# Patient Record
Sex: Male | Born: 2000 | Race: White | Hispanic: No | Marital: Single | Smoking: Current every day smoker
Health system: Southern US, Community
[De-identification: ages and names within clinical notes are randomized; demographics above are authoritative.]

## PROBLEM LIST (undated history)

## (undated) DIAGNOSIS — J45909 Unspecified asthma, uncomplicated: Secondary | ICD-10-CM

## (undated) DIAGNOSIS — R569 Unspecified convulsions: Secondary | ICD-10-CM

## (undated) DIAGNOSIS — F909 Attention-deficit hyperactivity disorder, unspecified type: Secondary | ICD-10-CM

## (undated) HISTORY — PX: OTHER SURGICAL HISTORY: SHX169

## (undated) HISTORY — PX: TONSILLECTOMY: SUR1361

## (undated) HISTORY — PX: ADENOIDECTOMY: SUR15

---
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
## 2022-09-24 ENCOUNTER — Encounter (HOSPITAL_COMMUNITY): Payer: Self-pay

## 2022-09-24 ENCOUNTER — Ambulatory Visit (HOSPITAL_COMMUNITY)
Admission: EM | Admit: 2022-09-24 | Discharge: 2022-09-24 | Disposition: A | Discharge status: Home / Self Care | Payer: Medicaid Other | Attending: Internal Medicine | Admitting: Internal Medicine

## 2022-09-24 DIAGNOSIS — M546 Pain in thoracic spine: Secondary | ICD-10-CM | POA: Diagnosis not present

## 2022-09-24 MED ORDER — METHOCARBAMOL 500 MG PO TABS: 500.0000 mg | ORAL_TABLET | Freq: Three times a day (TID) | ORAL | 0 refills | Status: AC | PRN

## 2022-09-24 MED ORDER — IBUPROFEN 600 MG PO TABS: 600.0000 mg | ORAL_TABLET | Freq: Two times a day (BID) | ORAL | 0 refills | Status: AC | PRN

## 2022-09-24 NOTE — Discharge Instructions (Signed)
You were seen today for thoracic back pain.  We are treating you with anti-inflammatories and muscle relaxers.  Please be aware that the muscle relaxers may cause sedation.  I encouraged heat, stretching and massage.  Please follow-up with your PCP if symptoms persist or worsen.

## 2022-09-24 NOTE — ED Triage Notes (Signed)
Pt presents to the office for back pain that started yesterday. Pt reports his back hurts so bad.

## 2022-09-24 NOTE — ED Provider Notes (Signed)
Keith Powers    CSN: [id number] Arrival date & time: 09/24/22  1259      History   Chief Complaint Chief Complaint  Patient presents with   Back Pain    HPI Keith Powers is a 21 y.o. male with a history of ADHD, asthma, seizures and depression presents to UC today with complaint of thoracic back pain.  He reports this started this morning.  He describes the pain as sharp and stabbing.  The pain does not radiate.  The pain is worse with movement.  He denies any decrease in range of motion.  He denies numbness, tingling or weakness of his upper or lower extremities.  He has not noticed any rash in the area.  He denies any injury to the area.  He does do heavy lifting at work but reports he did not work today.  He has tried Tylenol OTC with minimal relief of symptoms.  HPI  Past Medical History:  Diagnosis Date   ADHD    Asthma    Seizures [facility])     Patient Active Problem List   Diagnosis Date Noted   DMDD (disruptive mood dysregulation disorder) ([place]) 07/24/2017   MDD (major depressive disorder), recurrent severe, without psychosis ([place]) 07/24/2017   Suicidal ideation 07/24/2017   MDD (major depressive disorder) 07/23/2017    Past Surgical History:  Procedure Laterality Date   ADENOIDECTOMY     Cyst removal left wrist     TONSILLECTOMY         Home Medications    Prior to Admission medications   Medication Sig Start Date End Date Taking? Authorizing Provider  ibuprofen (ADVIL) 600 MG tablet Take 1 tablet (600 mg total) by mouth 2 (two) times daily as needed. 09/24/22  Yes Declyn Delsol, Coralie Keens, NP  methocarbamol (ROBAXIN) 500 MG tablet Take 1 tablet (500 mg total) by mouth every 8 (eight) hours as needed for muscle spasms. 09/24/22  Yes Fabricio Endsley, Coralie Keens, NP  ARIPiprazole (ABILIFY) 5 MG tablet Take 1 tablet (5 mg total) by mouth 2 (two) times daily. 01/17/18   Mordecai Maes, NP  divalproex (DEPAKOTE ER) 250 MG 24 hr tablet Take 3 tablets (750 mg total) by  mouth 2 (two) times daily. 01/17/18   Mordecai Maes, NP  ondansetron (ZOFRAN) 4 MG tablet Take 1 tablet (4 mg total) by mouth daily as needed for nausea or vomiting. 12/31/21   Willaim Rayas, NP  traZODone (DESYREL) 50 MG tablet Take 1 tablet (50 mg total) by mouth at bedtime. 01/17/18   Mordecai Maes, NP    Family History Family History  Problem Relation Age of Onset   Bipolar disorder Mother     Social History Social History   Tobacco Use   Smoking status: Never   Smokeless tobacco: Never  Vaping Use   Vaping Use: Never used  Substance Use Topics   Alcohol use: No   Drug use: No     Allergies   Shrimp [shellfish allergy]   Review of Systems Review of Systems  Respiratory:  Negative for cough, chest tightness and shortness of breath.   Cardiovascular:  Negative for chest pain.  Musculoskeletal:  Positive for back pain.       Thoracic  Skin:  Negative for rash.  Neurological:  Negative for weakness.     Physical Exam Triage Vital Signs ED Triage Vitals  Enc Vitals Group     BP 09/24/22 1426 (!) 157/96     Pulse Rate 09/24/22  1425 91     Resp 09/24/22 1425 20     Temp 09/24/22 1426 98 F (36.7 C)     Temp Source 09/24/22 1425 Oral     SpO2 09/24/22 1425 100 %     Weight --      Height --      Head Circumference --      Peak Flow --      Pain Score --      Pain Loc --      Pain Edu? --      Excl. in GC? --    No data found.  Updated Vital Signs BP (!) 157/96 (BP Location: Left Arm)   Pulse 87   Temp 98 F (36.7 C) (Oral)   Resp 20   SpO2 99%    Physical Exam Constitutional:      Appearance: He is normal weight.  Cardiovascular:     Rate and Rhythm: Normal rate and regular rhythm.  Pulmonary:     Effort: Pulmonary effort is normal.     Breath sounds: Normal breath sounds.  Musculoskeletal:        General: Tenderness present. Normal range of motion.     Comments: Pain with palpation over the thoracic spine and bilateral parathoracic  muscles.  Strength 5/5 BUE/BLE.  Skin:    Findings: No rash.  Neurological:     Mental Status: He is alert and oriented to person, place, and time.      UC Treatments / Results  Medications Ordered in UC Medications - No data to display  Initial Impression / Assessment and Plan / UC Course  I have reviewed the triage vital signs and the nursing notes.  Pertinent labs & imaging results that were available during my care of the patient were reviewed by me and considered in my medical decision making (see chart for details).    21 year old male with complaint of thoracic back pain x1 day.  DDx include osteoarthritis, scoliosis, thoracic strain, muscle strain of the thoracic region.  No indication for imaging at this time.  Exam indicates likely muscular strain of the thoracic region.  Will treat with Ibuprofen 600 mg twice daily x5 days.  We will also provide Rx for methocarbamol 500 mg every 8 hours as needed.  Encouraged heat, stretching and massage.  He will follow-up with his PCP if symptoms persist or worsen.  Final Clinical Impressions(s) / UC Diagnoses   Final diagnoses:  Acute bilateral thoracic back pain     Discharge Instructions      You were seen today for thoracic back pain.  We are treating you with anti-inflammatories and muscle relaxers.  Please be aware that the muscle relaxers may cause sedation.  I encouraged heat, stretching and massage.  Please follow-up with your PCP if symptoms persist or worsen.     ED Prescriptions     Medication Sig Dispense Auth. Provider   ibuprofen (ADVIL) 600 MG tablet Take 1 tablet (600 mg total) by mouth 2 (two) times daily as needed. 10 tablet Lorre Munroe, NP   methocarbamol (ROBAXIN) 500 MG tablet Take 1 tablet (500 mg total) by mouth every 8 (eight) hours as needed for muscle spasms. 15 tablet Lorre Munroe, NP      PDMP not reviewed this encounter.   Lorre Munroe, NP 09/24/22 1506

## 2023-01-26 IMAGING — CR DG SHOULDER 2+V*R*
2 series · 2 of 2 positions shown · non-contrast
Comparison: None.

CLINICAL DATA: Shoulder injury

EXAM:
RIGHT SHOULDER - 2+ VIEW

[w shoulder ap internal righ]
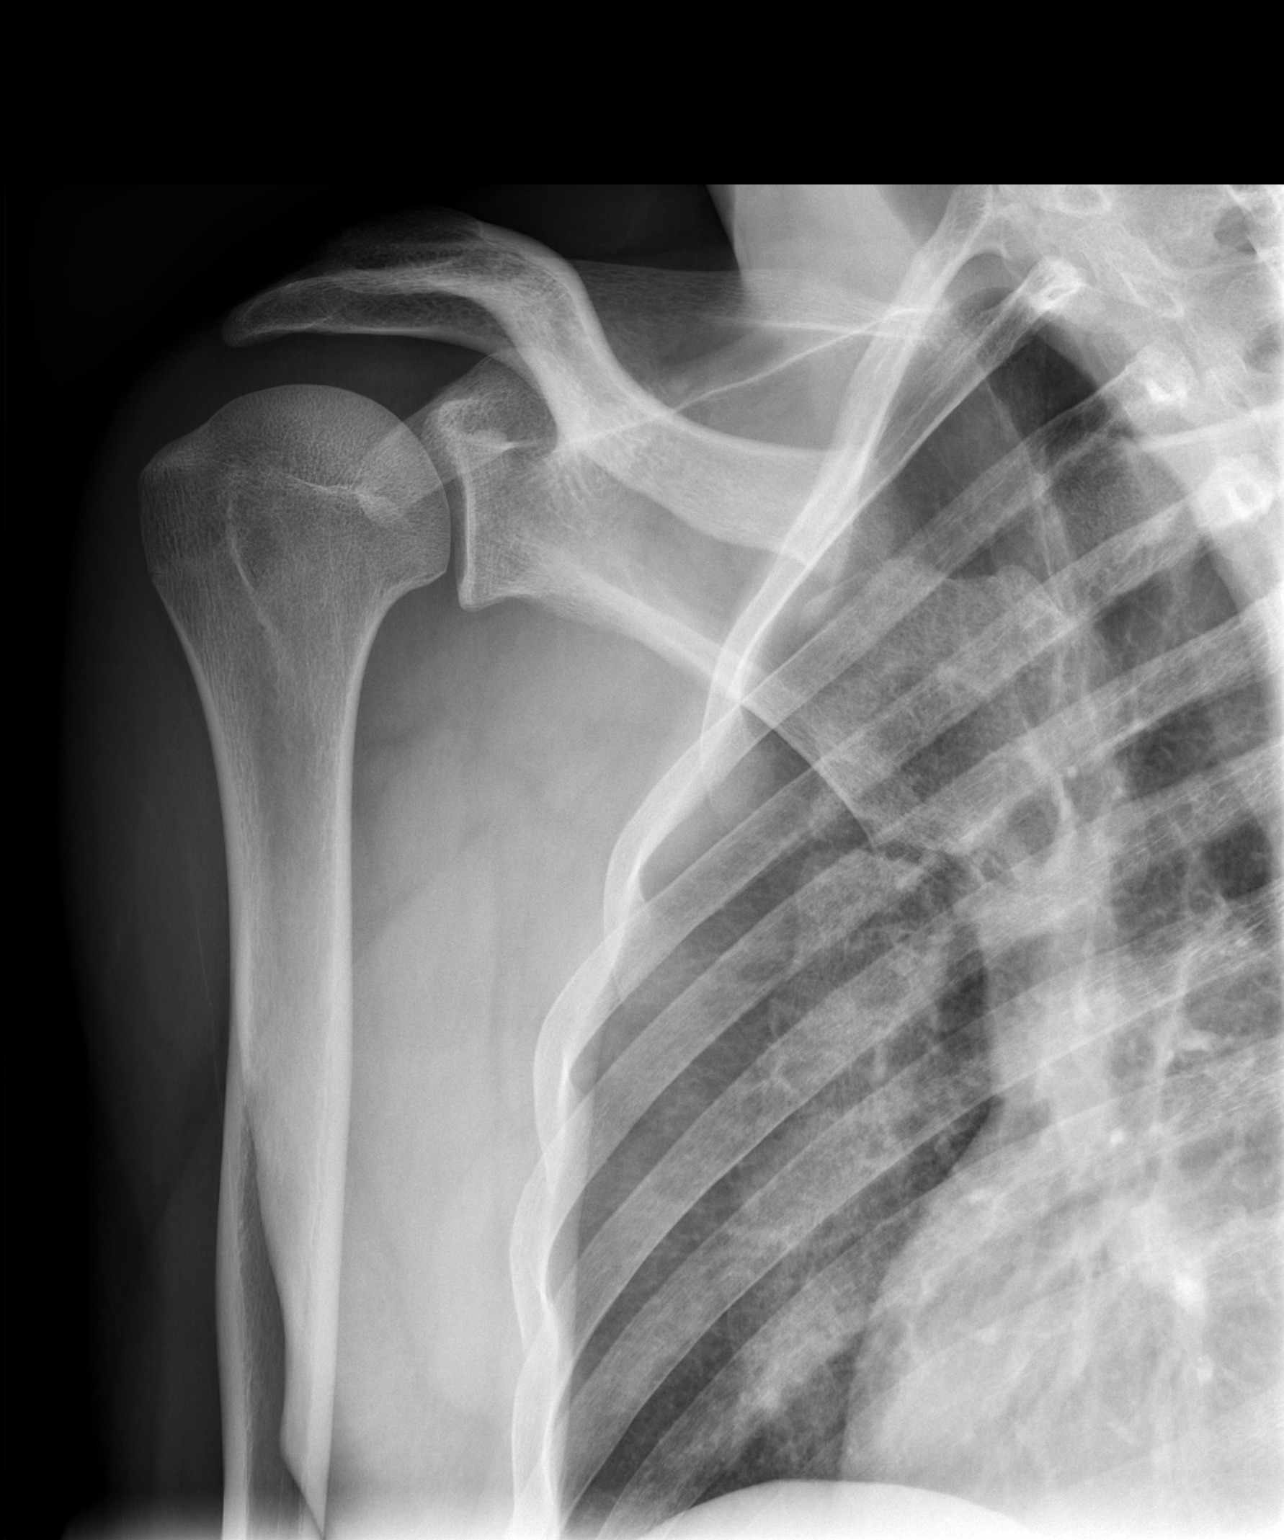

[w shoulder y view right]
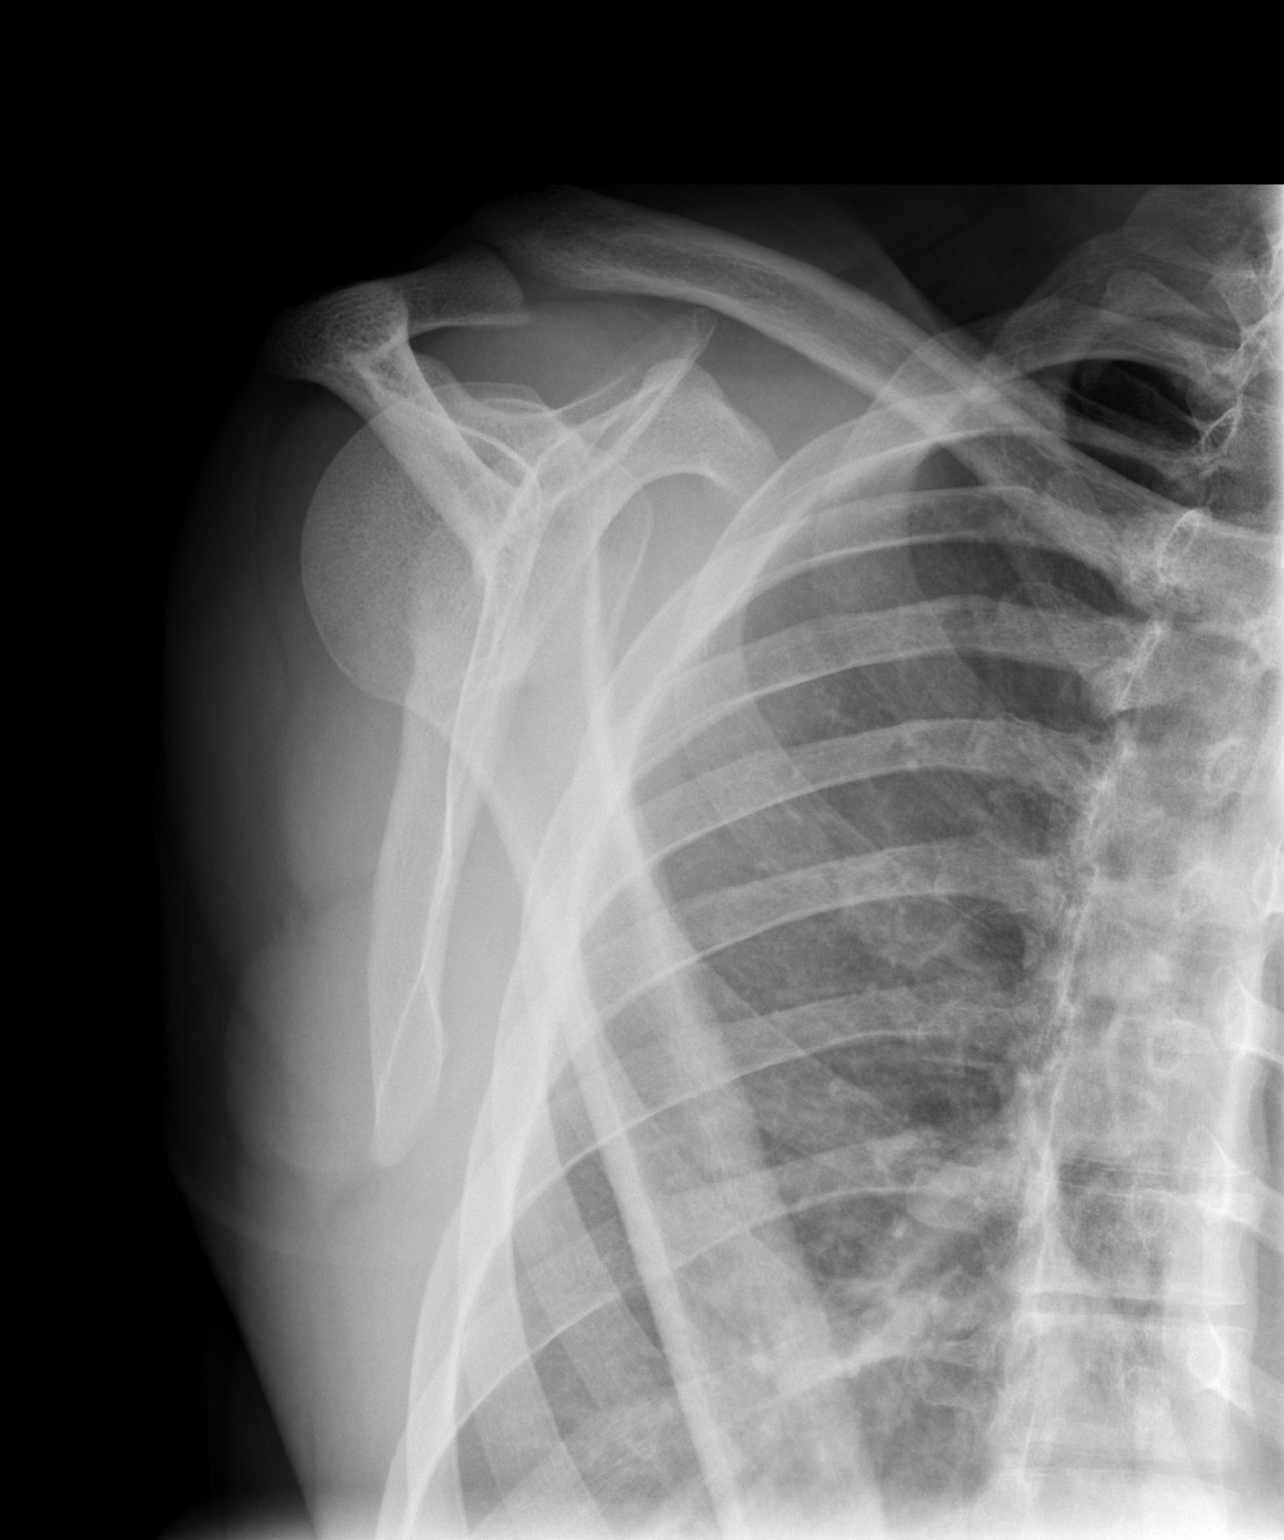

[2 of 2 positions shown; findings below may reference images not displayed]

FINDINGS: There is no evidence of fracture or dislocation. There is no
evidence of arthropathy or other focal bone abnormality. Soft
tissues are unremarkable.
IMPRESSION: Negative.

## 2023-07-16 ENCOUNTER — Other Ambulatory Visit: Payer: Self-pay

## 2023-07-16 ENCOUNTER — Telehealth: Payer: Self-pay | Admitting: Internal Medicine

## 2023-07-16 ENCOUNTER — Encounter: Payer: Self-pay | Admitting: *Deleted

## 2023-07-16 ENCOUNTER — Ambulatory Visit: Payer: BLUE CROSS/BLUE SHIELD

## 2023-07-16 ENCOUNTER — Ambulatory Visit
Admission: EM | Admit: 2023-07-16 | Discharge: 2023-07-16 | Disposition: A | Discharge status: Home / Self Care | Payer: BLUE CROSS/BLUE SHIELD | Attending: Internal Medicine | Admitting: Internal Medicine

## 2023-07-16 DIAGNOSIS — L03012 Cellulitis of left finger: Secondary | ICD-10-CM | POA: Diagnosis not present

## 2023-07-16 DIAGNOSIS — M79645 Pain in left finger(s): Secondary | ICD-10-CM

## 2023-07-16 MED ORDER — CEPHALEXIN 500 MG PO CAPS: 500.0000 mg | ORAL_CAPSULE | Freq: Four times a day (QID) | ORAL | 0 refills | Status: DC

## 2023-07-16 NOTE — Telephone Encounter (Signed)
Called patient to discuss x-ray results with no acute abnormality.  No change in therapy at this time.  All questions answered.

## 2023-07-16 NOTE — Discharge Instructions (Addendum)
I will call with x-ray results.  Antibiotic has been prescribed.  Follow-up if any symptoms persist or worsen.

## 2023-07-16 NOTE — ED Triage Notes (Signed)
Had small blister tip of left pinky finger- tried to pop it and now it is swollen and painful

## 2023-07-16 NOTE — ED Provider Notes (Signed)
[name]    CSN: [id number] Arrival date & time: 07/16/23  1407      History   Chief Complaint Chief Complaint  Patient presents with   Wound Check    HPI Keith Powers is a 22 y.o. male.   Patient presents with swelling, pain, erythema that started about 2 days ago to the tip of the left fifth digit.  Denies any injury to the area.  States that he popped it this morning and had some purulent drainage.  Denies any fever.   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 Authorizing Provider  cephALEXin (KEFLEX) 500 MG capsule Take 1 capsule (500 mg total) by mouth 4 (four) times daily. 07/16/23  Yes , Rolly Salter E, FNP  ARIPiprazole (ABILIFY) 5 MG tablet Take 1 tablet (5 mg total) by mouth 2 (two) times daily. 01/17/18   Denzil Magnuson, NP  divalproex (DEPAKOTE ER) 250 MG 24 hr tablet Take 3 tablets (750 mg total) by mouth 2 (two) times daily. 01/17/18   Denzil Magnuson, NP  ibuprofen (ADVIL) 600 MG tablet Take 1 tablet (600 mg total) by mouth 2 (two) times daily as needed. 09/24/22   Lorre Munroe, NP  methocarbamol (ROBAXIN) 500 MG tablet Take 1 tablet (500 mg total) by mouth every 8 (eight) hours as needed for muscle spasms. 09/24/22   Lorre Munroe, NP  ondansetron (ZOFRAN) 4 MG tablet Take 1 tablet (4 mg total) by mouth daily as needed for nausea or vomiting. 12/31/21   Herschell Dimes, NP   traZODone (DESYREL) 50 MG tablet Take 1 tablet (50 mg total) by mouth at bedtime. 01/17/18   Denzil Magnuson, NP    Family History Family History  Problem Relation Age of Onset   Bipolar disorder Mother     Social History Social History   Tobacco Use   Smoking status: Every Day    Types: Cigarettes, Cigars   Smokeless tobacco: Never  Vaping Use   Vaping status: Every Day  Substance Use Topics   Alcohol use: No   Drug use: No     Allergies   Shrimp [shellfish allergy]   Review of Systems Review of Systems Per HPI  Physical Exam Triage Vital Signs ED Triage Vitals  Encounter Vitals Group     BP 07/16/23 1513 (!) 140/84     Systolic BP Percentile --      Diastolic BP Percentile --      Pulse Rate 07/16/23 1513 90     Resp 07/16/23 1513 18     Temp 07/16/23 1513 97.9 F (36.6 C)     Temp Source 07/16/23 1513 Oral     SpO2 07/16/23 1513 97 %     Weight --      Height --      Head Circumference --  Peak Flow --      Pain Score 07/16/23 1511 10     Pain Loc --      Pain Education --      Exclude from Growth Chart --    No data found.  Updated Vital Signs BP (!) 140/84 (BP Location: Right Arm)   Pulse 90   Temp 97.9 F (36.6 C) (Oral)   Resp 18   SpO2 97%   Visual Acuity Right Eye Distance:   Left Eye Distance:   Bilateral Distance:    Right Eye Near:   Left Eye Near:    Bilateral Near:     Physical Exam Constitutional:      General: He is not in acute distress.    Appearance: Normal appearance. He is not toxic-appearing or diaphoretic.  HENT:     Head: Normocephalic and atraumatic.  Eyes:     Extraocular Movements: Extraocular movements intact.     Conjunctiva/sclera: Conjunctivae normal.  Pulmonary:     Effort: Pulmonary effort is normal.  Skin:    Comments: Patient has erythema and mild to moderate swelling present to the distal end of the left fifth digit.  Blisterlike lesion present to the end of the finger as well with no  purulent drainage.  Patient has full range of motion of finger and capillary refill and pulses seem to be intact. Nail is intact.   Neurological:     General: No focal deficit present.     Mental Status: He is alert and oriented to person, place, and time. Mental status is at baseline.  Psychiatric:        Mood and Affect: Mood normal.        Behavior: Behavior normal.        Thought Content: Thought content normal.        Judgment: Judgment normal.      UC Treatments / Results  Labs (all labs ordered are listed, but only abnormal results are displayed) Labs Reviewed - No data to display  EKG   Radiology DG Finger Little Left  Result Date: 07/16/2023 CLINICAL DATA:  Finger infection. Small blister at tip of left fifth finger. Tried to pop it now swollen and painful. EXAM: LEFT FINGER(S) - 2+ VIEW COMPARISON:  Left hand radiographs 01/20/2021 FINDINGS: Interval screw fixation of the prior distal fifth metacarpal fracture seen on remote 01/20/2021 radiographs with improved, now normal alignment. No evidence of hardware failure. Normal bone mineralization. The cortices are intact. Joint spaces are preserved. No acute fracture or dislocation. No subcutaneous air. IMPRESSION: 1. No radiographic evidence of acute osteomyelitis of the fifth finger. 2. Interval ORIF of the prior fifth metacarpal fracture. Electronically Signed   By: Neita Garnet M.D.   On: 07/16/2023 15:53    Procedures Procedures (including critical care time)  Medications Ordered in UC Medications - No data to display  Initial Impression / Assessment and Plan / UC Course  I have reviewed the triage vital signs and the nursing notes.  Pertinent labs & imaging results that were available during my care of the patient were reviewed by me and considered in my medical decision making (see chart for details).     X-ray completed to ensure there are no signs of osteomyelitis.  It was negative for any acute bony  abnormality.  Suspect paronychia versus cellulitis of finger so will treat with cephalexin.  Patient advised to monitor closely and follow-up if any symptoms persist or worsen.  Patient verbalized understanding and  was agreeable with plan.  Attempted to call patient to notify of x-ray results but there was no answer.  Left voicemail per privacy policy for call back. Final Clinical Impressions(s) / UC Diagnoses   Final diagnoses:  Cellulitis of left little finger     Discharge Instructions      I will call with x-ray results.  Antibiotic has been prescribed.  Follow-up if any symptoms persist or worsen.     ED Prescriptions     Medication Sig Dispense Auth. Provider   cephALEXin (KEFLEX) 500 MG capsule Take 1 capsule (500 mg total) by mouth 4 (four) times daily. 28 capsule [place], Acie Fredrickson, [place]      PDMP not reviewed this encounter.   Gustavus Bryant, [place] 07/16/23 [phone]

## 2023-07-18 ENCOUNTER — Telehealth: Payer: Self-pay

## 2023-07-18 DIAGNOSIS — L03012 Cellulitis of left finger: Secondary | ICD-10-CM

## 2023-07-18 MED ORDER — CEPHALEXIN 500 MG PO CAPS: 500.0000 mg | ORAL_CAPSULE | Freq: Four times a day (QID) | ORAL | 0 refills | Status: DC

## 2023-07-18 NOTE — Telephone Encounter (Signed)
Returning call to patient for the following:  MRN [id number] Keith Powers just called. Said he provided the wrong pharmacy and if it can be changed to [facility] on [address].  Patient states the Rx was sent and received but he needs it changed to another pharmacy. He has let this pharmacy know and they are giving him a hard time about transferring it.   I have let him know to make sure with original pharmacy that medication is returned to stock and not ran through his coverages.  Resent for patient.  B. Roten CMA

## 2023-10-05 ENCOUNTER — Ambulatory Visit
Admission: EM | Admit: 2023-10-05 | Discharge: 2023-10-05 | Disposition: A | Discharge status: Home / Self Care | Payer: BLUE CROSS/BLUE SHIELD | Attending: Physician Assistant | Admitting: Physician Assistant

## 2023-10-05 DIAGNOSIS — U071 COVID-19: Secondary | ICD-10-CM | POA: Diagnosis not present

## 2023-10-05 LAB — POC COVID19/FLU A&B COMBO
Covid Antigen, POC: POSITIVE — AB
Influenza A Antigen, POC: NEGATIVE
Influenza B Antigen, POC: NEGATIVE

## 2023-10-05 NOTE — ED Provider Notes (Signed)
[name]    CSN: [id number] Arrival date & time: 10/05/23  1834      History   Chief Complaint No chief complaint on file.   HPI Keith Powers is a 22 y.o. male.   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 Authorizing Provider  ARIPiprazole (ABILIFY) 5 MG tablet Take 1 tablet (5 mg total) by mouth 2 (two) times daily. 01/17/18   Denzil Magnuson, NP  cephALEXin (KEFLEX) 500 MG capsule Take 1 capsule (500 mg total) by mouth 4 (four) times daily. 07/18/23   Merrilee Jansky, MD  divalproex (DEPAKOTE ER) 250 MG 24 hr tablet Take 3 tablets (750 mg total) by mouth 2 (two) times daily. 01/17/18   Denzil Magnuson, NP  ibuprofen (ADVIL) 600 MG tablet Take 1 tablet (600 mg total) by mouth 2 (two) times daily as needed. 09/24/22   Lorre Munroe, NP  methocarbamol (ROBAXIN) 500 MG tablet Take 1 tablet (500 mg total) by mouth every 8 (eight) hours as needed for muscle spasms. 09/24/22   Lorre Munroe, NP  ondansetron (ZOFRAN) 4 MG tablet Take 1 tablet (4 mg total) by mouth daily as needed for nausea or vomiting. 12/31/21   Herschell Dimes, NP  traZODone (DESYREL) 50 MG tablet Take 1 tablet (50 mg total) by mouth at bedtime. 01/17/18   Denzil Magnuson, NP    Family History Family History  Problem Relation Age of Onset   Bipolar disorder Mother     Social History Social History   Tobacco Use   Smoking status:  Every Day    Types: Cigarettes, Cigars   Smokeless tobacco: Never  Vaping Use   Vaping status: Every Day  Substance Use Topics   Alcohol use: No   Drug use: No     Allergies   Shrimp [shellfish allergy]   Review of Systems Review of Systems  Constitutional:  Positive for chills. Negative for fever.  HENT:  Positive for congestion and sore throat. Negative for ear pain.   Eyes:  Negative for discharge and redness.  Respiratory:  Positive for cough. Negative for shortness of breath.   Gastrointestinal:  Negative for abdominal pain, nausea and vomiting.     Physical Exam Triage Vital Signs ED Triage Vitals  Encounter Vitals Group     BP      Systolic BP Percentile      Diastolic BP Percentile      Pulse      Resp      Temp      Temp src      SpO2      Weight      Height      Head Circumference      Peak Flow      Pain Score      Pain Loc  Pain Education      Exclude from Growth Chart    No data found.  Updated Vital Signs There were no vitals taken for this visit.     Physical Exam Vitals and nursing note reviewed.  Constitutional:      General: He is not in acute distress.    Appearance: He is well-developed. He is not ill-appearing.  HENT:     Head: Normocephalic and atraumatic.     Right Ear: Tympanic membrane normal.     Left Ear: Tympanic membrane normal.     Nose: Congestion present.     Mouth/Throat:     Mouth: Mucous membranes are moist.     Pharynx: Posterior oropharyngeal erythema present. No oropharyngeal exudate.     Tonsils: 0 on the right. 0 on the left.  Eyes:     Conjunctiva/sclera: Conjunctivae normal.  Cardiovascular:     Rate and Rhythm: Normal rate and regular rhythm.     Heart sounds: Normal heart sounds. No murmur heard. Pulmonary:     Effort: Pulmonary effort is normal. No respiratory distress.     Breath sounds: Normal breath sounds. No wheezing, rhonchi or rales.  Skin:    General: Skin is warm and dry.   Neurological:     Mental Status: He is alert.  Psychiatric:        Mood and Affect: Mood normal.        Behavior: Behavior normal.      UC Treatments / Results  Labs (all labs ordered are listed, but only abnormal results are displayed) Labs Reviewed - No data to display  EKG   Radiology No results found.  Procedures Procedures (including critical care time)  Medications Ordered in UC Medications - No data to display  Initial Impression / Assessment and Plan / UC Course  I have reviewed the triage vital signs and the nursing notes.  Pertinent labs & imaging results that were available during my care of the patient were reviewed by me and considered in my medical decision making (see chart for details).     *** Final Clinical Impressions(s) / UC Diagnoses   Final diagnoses:  None   Discharge Instructions   None    ED Prescriptions   None    PDMP not reviewed this encounter.

## 2023-10-05 NOTE — ED Triage Notes (Signed)
"  This started about 2 nights ago with chills, shivering, then the next minute burning up". "My eyes are very heavy and I fee like I could go to sleep at any point". No known Fever (degree) "but I feel like it". "Some runny nose". No cough. No Sob. No rash.

## 2023-11-25 ENCOUNTER — Telehealth: Payer: Self-pay | Admitting: Emergency Medicine

## 2023-11-25 ENCOUNTER — Ambulatory Visit
Admission: EM | Admit: 2023-11-25 | Discharge: 2023-11-25 | Disposition: A | Discharge status: Home / Self Care | Payer: BLUE CROSS/BLUE SHIELD | Attending: Physician Assistant | Admitting: Physician Assistant

## 2023-11-25 ENCOUNTER — Encounter: Payer: Self-pay | Admitting: Emergency Medicine

## 2023-11-25 DIAGNOSIS — L03012 Cellulitis of left finger: Secondary | ICD-10-CM

## 2023-11-25 MED ORDER — DOXYCYCLINE HYCLATE 100 MG PO CAPS: 100.0000 mg | ORAL_CAPSULE | Freq: Two times a day (BID) | ORAL | 0 refills | Status: AC

## 2023-11-25 MED ORDER — DOXYCYCLINE HYCLATE 100 MG PO CAPS: 100.0000 mg | ORAL_CAPSULE | Freq: Two times a day (BID) | ORAL | 0 refills | Status: DC

## 2023-11-25 NOTE — Telephone Encounter (Signed)
Original message - The following patient is requesting to have their prescription moved to [facility] on [address]  Suzan Nailer. Howerter Male, 22 y.o., 07/12/2001 [phone] MRN: [id number]  Returned pt's call to make aware that [facility] [address] did in fact receive the RX sent in for the encounter on 11/25/2023. The med is ready for pick up as of 4 pm on 11/25/2023. Left pt a VM with detailed message.

## 2023-11-25 NOTE — ED Triage Notes (Signed)
Pt presents with finger pain. Left hand pinky finger. Pt has "boil" on pinky finger that is swollen and hurts to touch. Area is also hot to touch. Pt says he was seen here previously for the same issue and was given antibiotic.

## 2023-12-05 NOTE — ED Provider Notes (Signed)
[name]    CSN: [id number] Arrival date & time: 11/25/23  1135      History   Chief Complaint Chief Complaint  Patient presents with   Hand Pain    HPI Keith Powers is a 23 y.o. male.   Patient here today for evaluation of left fifth finger swelling and pain.  He reports that he has an infection that is similar to what he has had in the past.  He reports in the past he has taken antibiotic for same.  He denies any fever.  He notes symptoms started few days ago but worsened yesterday.  The history is provided by the patient.  Hand Pain Pertinent negatives include no shortness of breath.    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 Authorizing Provider  acetaminophen (TYLENOL) 500 MG tablet Take 1,000 mg by mouth every 6 (six) hours as needed for fever. Last dose: 3pm.    [provider]  ARIPiprazole (ABILIFY) 5 MG tablet Take 1 tablet (5 mg total) by mouth 2 (two) times daily. 01/17/18   Denzil Magnuson, NP  aspirin 81 MG chewable tablet Chew 81 mg by mouth daily. 04/20/20   [provider]  CARESTART COVID-19 HOME TEST KIT See admin instructions. 07/19/23   [provider]  cephALEXin (KEFLEX) 500 MG capsule Take 1 capsule (500 mg total) by mouth 4 (four) times daily. 07/18/23   Merrilee Jansky, MD  divalproex (DEPAKOTE ER) 250 MG 24 hr tablet  Take 3 tablets (750 mg total) by mouth 2 (two) times daily. 01/17/18   Denzil Magnuson, NP  Docusate Sodium (DSS) 100 MG CAPS Take 1 capsule by mouth 2 (two) times daily. 04/20/20   [provider]  gabapentin (NEURONTIN) 100 MG capsule Take 100 mg by mouth. 04/20/20   [provider]  HYDROcodone-acetaminophen (NORCO/VICODIN) 5-325 MG tablet Take 1-2 tablets by mouth every 6 (six) hours as needed for severe pain (pain score 7-10). 04/20/20   [provider]  ibuprofen (ADVIL) 600 MG tablet Take 1 tablet (600 mg total) by mouth 2 (two) times daily as needed. 09/24/22   Lorre Munroe, NP  ibuprofen (ADVIL) 800 MG tablet Take 800 mg by mouth every 8 (eight) hours as needed. 04/26/20   [provider]  melatonin 3 MG TABS tablet Take 3 mg by mouth at bedtime as needed. 12/27/11   [provider]  methocarbamol (ROBAXIN) 500 MG tablet Take 1 tablet (500 mg total) by mouth every 8 (eight) hours as needed for muscle spasms. 09/24/22   Lorre Munroe, NP  ondansetron (ZOFRAN) 4 MG tablet Take 1 tablet (4 mg total) by mouth daily as needed for nausea or vomiting. 12/31/21   Herschell Dimes, NP  traZODone (DESYREL) 50 MG tablet Take 1 tablet (50  mg total) by mouth at bedtime. 01/17/18   Denzil Magnuson, NP    Family History Family History  Problem Relation Age of Onset   Bipolar disorder Mother     Social History Social History   Tobacco Use   Smoking status: Every Day    Types: Cigarettes, Cigars    Passive exposure: Current   Smokeless tobacco: Never  Vaping Use   Vaping status: Every Day   Substances: Nicotine, Flavoring  Substance Use Topics   Alcohol use: No   Drug use: No     Allergies   Shellfish allergy   Review of Systems Review of Systems  Constitutional:  Negative for chills and fever.  Eyes:  Negative for discharge and redness.  Respiratory:  Negative for shortness of breath.   Skin:  Positive for color change and wound.   Neurological:  Negative for numbness.     Physical Exam Triage Vital Signs ED Triage Vitals  Encounter Vitals Group     BP 11/25/23 1343 (!) 141/79     Systolic BP Percentile --      Diastolic BP Percentile --      Pulse Rate 11/25/23 1343 80     Resp 11/25/23 1343 18     Temp 11/25/23 1343 98 F (36.7 C)     Temp Source 11/25/23 1343 Oral     SpO2 11/25/23 1343 97 %     Weight 11/25/23 1341 179 lb 14.3 oz (81.6 kg)     Height 11/25/23 1341 6\' 1"  (1.854 m)     Head Circumference --      Peak Flow --      Pain Score 11/25/23 1341 6     Pain Loc --      Pain Education --      Exclude from Growth Chart --    No data found.  Updated Vital Signs BP (!) 141/79 (BP Location: Right Arm)   Pulse 80   Temp 98 F (36.7 C) (Oral)   Resp 18   Ht 6\' 1"  (1.854 m)   Wt 179 lb 14.3 oz (81.6 kg)   SpO2 97%   BMI 23.73 kg/m   Visual Acuity Right Eye Distance:   Left Eye Distance:   Bilateral Distance:    Right Eye Near:   Left Eye Near:    Bilateral Near:     Physical Exam Vitals and nursing note reviewed.  Constitutional:      General: He is not in acute distress.    Appearance: Normal appearance. He is not ill-appearing.  HENT:     Head: Normocephalic and atraumatic.  Eyes:     Conjunctiva/sclera: Conjunctivae normal.  Cardiovascular:     Rate and Rhythm: Normal rate.  Pulmonary:     Effort: Pulmonary effort is normal. No respiratory distress.  Musculoskeletal:     Comments: Diffuse swelling and erythema to left 5th finger  Neurological:     Mental Status: He is alert.  Psychiatric:        Mood and Affect: Mood normal.        Behavior: Behavior normal.        Thought Content: Thought content normal.      UC Treatments / Results  Labs (all labs ordered are listed, but only abnormal results are displayed) Labs Reviewed - No data to display  EKG   Radiology No results found.  Procedures Procedures (including critical care time)  Medications  Ordered in UC Medications - No data to  display  Initial Impression / Assessment and Plan / UC Course  I have reviewed the triage vital signs and the nursing notes.  Pertinent labs & imaging results that were available during my care of the patient were reviewed by me and considered in my medical decision making (see chart for details).    Will treat to cover cellulitis with doxycycline with strict instruction to report to ED if no improvement over the next 24 hours or sooner with any worsening .   Final Clinical Impressions(s) / UC Diagnoses   Final diagnoses:  Cellulitis of finger, left   Discharge Instructions   None    ED Prescriptions     Medication Sig Dispense Auth. Provider   doxycycline (VIBRAMYCIN) 100 MG capsule  (Status: Discontinued) Take 1 capsule (100 mg total) by mouth 2 (two) times daily for 7 days. 14 capsule Erma Pinto F, PA-C   doxycycline (VIBRAMYCIN) 100 MG capsule Take 1 capsule (100 mg total) by mouth 2 (two) times daily for 7 days. 14 capsule Tomi Bamberger, PA-C      PDMP not reviewed this encounter.   Tomi Bamberger, PA-C 12/05/23 1526

## 2024-02-03 ENCOUNTER — Ambulatory Visit
Admission: EM | Admit: 2024-02-03 | Discharge: 2024-02-03 | Disposition: A | Discharge status: Home / Self Care | Payer: MEDICAID | Attending: Family Medicine | Admitting: Family Medicine

## 2024-02-03 ENCOUNTER — Encounter: Payer: Self-pay | Admitting: Emergency Medicine

## 2024-02-03 DIAGNOSIS — R112 Nausea with vomiting, unspecified: Secondary | ICD-10-CM | POA: Insufficient documentation

## 2024-02-03 DIAGNOSIS — R6889 Other general symptoms and signs: Secondary | ICD-10-CM | POA: Insufficient documentation

## 2024-02-03 DIAGNOSIS — R197 Diarrhea, unspecified: Secondary | ICD-10-CM | POA: Diagnosis present

## 2024-02-03 LAB — RESP PANEL BY RT-PCR (FLU A&B, COVID) ARPGX2
Influenza A by PCR: NEGATIVE
Influenza B by PCR: NEGATIVE
SARS Coronavirus 2 by RT PCR: NEGATIVE

## 2024-02-03 MED ORDER — ONDANSETRON 4 MG PO TBDP
4.0000 mg | ORAL_TABLET | Freq: Once | ORAL | Status: AC
  Administered 2024-02-03: 4 mg via ORAL

## 2024-02-03 MED ORDER — ONDANSETRON HCL 4 MG PO TABS: 4.0000 mg | ORAL_TABLET | Freq: Four times a day (QID) | ORAL | 0 refills | Status: AC

## 2024-02-03 NOTE — ED Triage Notes (Signed)
 Pt c/o vomiting, nausea, abdominal pain, diarrhea. Started about 3 days ago.  Denies fever. He states at one point he was vomiting blood but has not since.

## 2024-02-03 NOTE — Discharge Instructions (Addendum)
 You COVID and influenza tests are negative.  I suspect you have a viral illness that will gradually improve with time. Stop by the pharmacy to pick up your prescriptions.  Follow up with your primary care provider or return to the urgent care, if not improving.

## 2024-02-03 NOTE — ED Provider Notes (Signed)
 [facility]    CSN: [id number] Arrival date & time: 02/03/24  1137      History   Chief Complaint Chief Complaint  Patient presents with   Emesis    HPI Keith Powers is a 23 y.o. male.   HPI  History obtained from the patient. Keith Powers presents for nausea, vomiting and headache since Friday.  Has some blood in her emesis yesterday.  Endorses headache, diarrhea, chills, rhinorrhea and slight cough.  Denies fever, wheezing, and shortness of breath. Taking tylenol.   Keith Powers is sick too.  Thinks she has the flu.    Has history of asthma. Has not needed an inhaler recently.     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 Authorizing Provider  ondansetron (ZOFRAN) 4 MG tablet Take 1 tablet (4 mg total) by mouth every 6 (six) hours. 02/03/24  Yes Teruo Stilley, Seward Meth, DO  acetaminophen (TYLENOL) 500 MG tablet Take 1,000 mg by mouth every 6 (six) hours as needed for fever. Last dose: 3pm.    [provider]  ARIPiprazole (ABILIFY) 5 MG tablet Take 1 tablet (5 mg total) by mouth 2 (two) times daily. 01/17/18   Denzil Magnuson, NP  aspirin 81 MG chewable tablet Chew 81 mg by mouth daily. 04/20/20   [provider]  CARESTART COVID-19 HOME TEST KIT See admin instructions. 07/19/23   [provider]  divalproex (DEPAKOTE ER) 250 MG 24 hr tablet Take 3 tablets (750 mg total) by  mouth 2 (two) times daily. 01/17/18   Denzil Magnuson, NP  Docusate Sodium (DSS) 100 MG CAPS Take 1 capsule by mouth 2 (two) times daily. 04/20/20   [provider]  gabapentin (NEURONTIN) 100 MG capsule Take 100 mg by mouth. 04/20/20   [provider]  HYDROcodone-acetaminophen (NORCO/VICODIN) 5-325 MG tablet Take 1-2 tablets by mouth every 6 (six) hours as needed for severe pain (pain score 7-10). 04/20/20   [provider]  ibuprofen (ADVIL) 600 MG tablet Take 1 tablet (600 mg total) by mouth 2 (two) times daily as needed. 09/24/22   Lorre Munroe, NP  ibuprofen (ADVIL) 800 MG tablet Take 800 mg by mouth every 8 (eight) hours as needed. 04/26/20   [provider]  melatonin 3 MG TABS tablet Take 3 mg by mouth at bedtime as needed. 12/27/11   [provider]  methocarbamol (ROBAXIN) 500 MG tablet Take 1 tablet (500 mg total) by mouth every 8 (eight) hours as needed for muscle spasms. 09/24/22   Lorre Munroe, NP  traZODone (DESYREL) 50 MG tablet Take 1 tablet (50 mg total) by mouth at bedtime. 01/17/18   Denzil Magnuson, NP    Family History Family History  Problem Relation Age of Onset   Bipolar disorder Mother     Social History Social History  Tobacco Use   Smoking status: Every Day    Types: Cigarettes, Cigars    Passive exposure: Current   Smokeless tobacco: Never  Vaping Use   Vaping status: Every Day   Substances: Nicotine, Flavoring  Substance Use Topics   Alcohol use: No   Drug use: No     Allergies   Shellfish allergy   Review of Systems Review of Systems: negative unless otherwise stated in HPI.      Physical Exam Triage Vital Signs ED Triage Vitals  Encounter Vitals Group     BP 02/03/24 1213 138/87     Systolic BP Percentile --      Diastolic BP Percentile --      Pulse Rate 02/03/24 1213 80     Resp 02/03/24 1213 16     Temp 02/03/24 1213 98.5 F (36.9 C)     Temp Source 02/03/24 1213 Oral     SpO2  02/03/24 1213 98 %     Weight 02/03/24 1211 179 lb 14.3 oz (81.6 kg)     Height 02/03/24 1211 6\' 1"  (1.854 m)     Head Circumference --      Peak Flow --      Pain Score 02/03/24 1210 5     Pain Loc --      Pain Education --      Exclude from Growth Chart --    No data found.  Updated Vital Signs BP 138/87 (BP Location: Right Arm)   Pulse 80   Temp 98.5 F (36.9 C) (Oral)   Resp 16   Ht 6\' 1"  (1.854 m)   Wt 81.6 kg   SpO2 98%   BMI 23.73 kg/m   Visual Acuity Right Eye Distance:   Left Eye Distance:   Bilateral Distance:    Right Eye Near:   Left Eye Near:    Bilateral Near:     Physical Exam GEN:     alert, non-toxic appearing male in no distress    HENT:  mucus membranes moist, oropharyngeal without lesions or erythema, no tonsillar hypertrophy or exudates, no nasal discharge EYES:   no scleral injection or discharge NECK:  normal ROM, no meningismus   RESP:  no increased work of breathing, clear to auscultation bilaterally CVS:   regular rate and rhythm Skin:   warm and dry    UC Treatments / Results  Labs (all labs ordered are listed, but only abnormal results are displayed) Labs Reviewed  RESP PANEL BY RT-PCR (FLU A&B, COVID) [id number]    EKG   Radiology No results found.  Procedures Procedures (including critical care time)  Medications Ordered in UC Medications  ondansetron (ZOFRAN-ODT) disintegrating tablet 4 mg (4 mg Oral Given 02/03/24 1256)    Initial Impression / Assessment and Plan / UC Course  I have reviewed the triage vital signs and the nursing notes.  Pertinent labs & imaging results that were available during my care of the patient were reviewed by me and considered in my medical decision making (see chart for details).       Pt is a 23 y.o. male who presents for 2 days of respiratory and GI  symptoms. [name] is afebrile here without recent antipyretics. Satting well on room air. Given Zofran for nausea. Overall pt is non-toxic  appearing, well hydrated, without respiratory distress. Pulmonary exam is unremarkable.  COVID and influenza panel obtained and was negative.   History consistent with viral illness. Zofran prescribed. Discussed symptomatic treatment.  Explained lack of efficacy of antibiotics in viral disease.  Typical duration of symptoms discussed.   Return and ED precautions given and voiced understanding. Discussed MDM, treatment plan and plan for follow-up with patient  who agrees with plan.     Final Clinical Impressions(s) / UC Diagnoses   Final diagnoses:  Flu-like symptoms  Nausea vomiting and diarrhea     Discharge Instructions      You COVID and influenza tests are negative.  I suspect you have a viral illness that will gradually improve with time. Stop by the pharmacy to pick up your prescriptions.  Follow up with your primary care provider or return to the urgent care, if not improving.       ED Prescriptions     Medication Sig Dispense Auth. Provider   ondansetron (ZOFRAN) 4 MG tablet Take 1 tablet (4 mg total) by mouth every 6 (six) hours. 20 tablet Katha Cabal, DO      PDMP not reviewed this encounter.   Katha Cabal, DO 02/03/24 1353
